# Patient Record
Sex: Male | Born: 1995 | Race: White | Hispanic: No | Marital: Single
Health system: Southern US, Community
[De-identification: ages and names within clinical notes are randomized; demographics above are authoritative.]

## PROBLEM LIST (undated history)

## (undated) HISTORY — PX: TONSILLECTOMY: SUR1361

---

## 2019-05-15 ENCOUNTER — Other Ambulatory Visit: Payer: Self-pay

## 2019-05-15 ENCOUNTER — Encounter (HOSPITAL_COMMUNITY): Payer: Self-pay | Admitting: Emergency Medicine

## 2019-05-15 ENCOUNTER — Emergency Department (HOSPITAL_COMMUNITY): Payer: 59

## 2019-05-15 ENCOUNTER — Emergency Department (HOSPITAL_COMMUNITY)
Admission: EM | Admit: 2019-05-15 | Discharge: 2019-05-15 | Disposition: A | Discharge status: Home / Self Care | Payer: 59 | Attending: Emergency Medicine | Admitting: Emergency Medicine

## 2019-05-15 DIAGNOSIS — Z20822 Contact with and (suspected) exposure to covid-19: Secondary | ICD-10-CM | POA: Diagnosis not present

## 2019-05-15 DIAGNOSIS — N200 Calculus of kidney: Secondary | ICD-10-CM | POA: Diagnosis not present

## 2019-05-15 DIAGNOSIS — R109 Unspecified abdominal pain: Secondary | ICD-10-CM

## 2019-05-15 LAB — CBC WITH DIFFERENTIAL/PLATELET
Abs Immature Granulocytes: 0.1 10*3/uL — ABNORMAL HIGH (ref 0.00–0.07)
Basophils Absolute: 0.1 10*3/uL (ref 0.0–0.1)
Basophils Relative: 0 %
Eosinophils Absolute: 0 10*3/uL (ref 0.0–0.5)
Eosinophils Relative: 0 %
HCT: 47.5 % (ref 39.0–52.0)
Hemoglobin: 16 g/dL (ref 13.0–17.0)
Immature Granulocytes: 1 %
Lymphocytes Relative: 9 %
Lymphs Abs: 1.3 10*3/uL (ref 0.7–4.0)
MCH: 32.9 pg (ref 26.0–34.0)
MCHC: 33.7 g/dL (ref 30.0–36.0)
MCV: 97.7 fL (ref 80.0–100.0)
Monocytes Absolute: 1.2 10*3/uL — ABNORMAL HIGH (ref 0.1–1.0)
Monocytes Relative: 8 %
Neutro Abs: 12.6 10*3/uL — ABNORMAL HIGH (ref 1.7–7.7)
Neutrophils Relative %: 82 %
Platelets: 227 10*3/uL (ref 150–400)
RBC: 4.86 MIL/uL (ref 4.22–5.81)
RDW: 12.2 % (ref 11.5–15.5)
WBC: 15.3 10*3/uL — ABNORMAL HIGH (ref 4.0–10.5)
nRBC: 0 % (ref 0.0–0.2)

## 2019-05-15 LAB — COMPREHENSIVE METABOLIC PANEL
ALT: 37 U/L (ref 0–44)
AST: 34 U/L (ref 15–41)
Albumin: 5.1 g/dL — ABNORMAL HIGH (ref 3.5–5.0)
Alkaline Phosphatase: 89 U/L (ref 38–126)
Anion gap: 15 (ref 5–15)
BUN: 13 mg/dL (ref 6–20)
CO2: 15 mmol/L — ABNORMAL LOW (ref 22–32)
Calcium: 10.2 mg/dL (ref 8.9–10.3)
Chloride: 111 mmol/L (ref 98–111)
Creatinine, Ser: 1.4 mg/dL — ABNORMAL HIGH (ref 0.61–1.24)
GFR calc Af Amer: >60 mL/min (ref >60)
GFR calc non Af Amer: >60 mL/min (ref >60)
Glucose, Bld: 150 mg/dL — ABNORMAL HIGH (ref 70–99)
Potassium: 3.5 mmol/L (ref 3.5–5.1)
Sodium: 141 mmol/L (ref 135–145)
Total Bilirubin: 0.9 mg/dL (ref 0.3–1.2)
Total Protein: 7.7 g/dL (ref 6.5–8.1)

## 2019-05-15 LAB — URINALYSIS, ROUTINE W REFLEX MICROSCOPIC
Bilirubin Urine: NEGATIVE
Glucose, UA: NEGATIVE mg/dL
Ketones, ur: 20 mg/dL — AB
Leukocytes,Ua: NEGATIVE
Nitrite: NEGATIVE
Protein, ur: 100 mg/dL — AB
RBC / HPF: >50 RBC/hpf — ABNORMAL HIGH (ref 0–5)
Specific Gravity, Urine: 1.025 (ref 1.005–1.030)
pH: 5 (ref 5.0–8.0)

## 2019-05-15 LAB — LIPASE, BLOOD: Lipase: 22 U/L (ref 11–51)

## 2019-05-15 LAB — RESPIRATORY PANEL BY RT PCR (FLU A&B, COVID)
Influenza A by PCR: NEGATIVE
Influenza B by PCR: NEGATIVE
SARS Coronavirus 2 by RT PCR: NEGATIVE

## 2019-05-15 MED ORDER — ONDANSETRON HCL 4 MG/2ML IJ SOLN
4.0000 mg | Freq: Once | INTRAMUSCULAR | Status: AC
  Administered 2019-05-15: 4 mg via INTRAVENOUS
  Filled 2019-05-15: qty 2

## 2019-05-15 MED ORDER — HYDROMORPHONE HCL 1 MG/ML IJ SOLN
1.0000 mg | Freq: Once | INTRAMUSCULAR | Status: AC
  Administered 2019-05-15: 1 mg via INTRAVENOUS
  Filled 2019-05-15: qty 1

## 2019-05-15 MED ORDER — TAMSULOSIN HCL 0.4 MG PO CAPS: 0.4000 mg | ORAL_CAPSULE | Freq: Every day | ORAL | 0 refills | Status: AC

## 2019-05-15 MED ORDER — HYDROCODONE-ACETAMINOPHEN 5-325 MG PO TABS: 1.0000 | ORAL_TABLET | ORAL | 0 refills | Status: AC | PRN

## 2019-05-15 MED ORDER — ONDANSETRON HCL 4 MG PO TABS: 4.0000 mg | ORAL_TABLET | Freq: Four times a day (QID) | ORAL | 0 refills | Status: AC

## 2019-05-15 NOTE — ED Triage Notes (Signed)
Per EMS-RLQ pain since this am-CBG 131-vomited this am and twice in ambulance

## 2019-05-15 NOTE — ED Provider Notes (Signed)
[facility]-EMERGENCY DEPT Provider Note   CSN: [id number] Arrival date & time: 05/15/19  1032     History No chief complaint on file.   Adrian Knight is a 24 y.o. male.  The history is provided by the patient and medical records. No language interpreter was used.  Testicle Pain This is a new problem. The current episode started 1 to 2 hours ago (almost 3 hours ago). The problem occurs constantly. The problem has been gradually worsening. Pertinent negatives include no chest pain, no abdominal pain (L flank pain), no headaches and no shortness of breath. Nothing aggravates the symptoms. Nothing relieves the symptoms. He has tried nothing for the symptoms. The treatment provided no relief.       History reviewed. No pertinent past medical history.  There are no problems to display for this patient.   Past Surgical History:  Procedure Laterality Date  . TONSILLECTOMY         No family history on file.  Social History   Tobacco Use  . Smoking status: Not on file  Substance Use Topics  . Alcohol use: Not on file  . Drug use: Not on file    Home Medications Prior to Admission medications   Not on File    Allergies    Patient has no known allergies.  Review of Systems   Review of Systems  Constitutional: Negative for chills, diaphoresis, fatigue and fever.  HENT: Negative for congestion.   Respiratory: Negative for chest tightness and shortness of breath.   Cardiovascular: Negative for chest pain.  Gastrointestinal: Positive for nausea and vomiting. Negative for abdominal distention, abdominal pain (L flank pain), constipation and diarrhea.  Genitourinary: Positive for flank pain, scrotal swelling and testicular pain. Negative for discharge, dysuria, frequency, hematuria, penile pain and urgency.  Musculoskeletal: Positive for back pain (L CVA). Negative for neck pain.  Skin: Negative for rash and wound.  Neurological: Negative for  light-headedness and headaches.  Psychiatric/Behavioral: Negative for agitation.    Physical Exam Updated Vital Signs BP 126/86 (BP Location: Right Arm)   Pulse 79   Temp 97.9 F (36.6 C) (Oral)   Resp 18   SpO2 100%   Physical Exam Vitals and nursing note reviewed. Exam conducted with a chaperone present.  Constitutional:      General: He is not in acute distress.    Appearance: He is well-developed. He is ill-appearing. He is not toxic-appearing or diaphoretic.  HENT:     Head: Normocephalic and atraumatic.     Nose: Nose normal. No congestion or rhinorrhea.     Mouth/Throat:     Mouth: Mucous membranes are moist.     Pharynx: No oropharyngeal exudate or posterior oropharyngeal erythema.  Eyes:     Conjunctiva/sclera: Conjunctivae normal.  Cardiovascular:     Rate and Rhythm: Normal rate and regular rhythm.     Heart sounds: No murmur.  Pulmonary:     Effort: Pulmonary effort is normal. No respiratory distress.     Breath sounds: Normal breath sounds.  Abdominal:     General: Bowel sounds are normal. There is no distension.     Palpations: Abdomen is soft.     Tenderness: There is no abdominal tenderness. There is left CVA tenderness. There is no right CVA tenderness, guarding or rebound.     Hernia: No hernia is present.  Genitourinary:    Penis: Normal and circumcised.      Testes:  Right: Tenderness not present.        Left: Tenderness and swelling present.    Musculoskeletal:        General: Tenderness present.     Cervical back: Neck supple.       Back:     Left lower leg: No edema.     Comments: L CVA tenderness  Skin:    General: Skin is warm and dry.     Capillary Refill: Capillary refill takes less than 2 seconds.     Findings: No erythema.  Neurological:     Mental Status: He is alert.  Psychiatric:        Mood and Affect: Mood normal.     ED Results / Procedures / Treatments   Labs (all labs ordered are listed, but only abnormal  results are displayed) Labs Reviewed  CBC WITH DIFFERENTIAL/PLATELET - Abnormal; Notable for the following components:      Result Value   WBC 15.3 (*)    Neutro Abs 12.6 (*)    Monocytes Absolute 1.2 (*)    Abs Immature Granulocytes 0.10 (*)    All other components within normal limits  COMPREHENSIVE METABOLIC PANEL - Abnormal; Notable for the following components:   CO2 15 (*)    Glucose, Bld 150 (*)    Creatinine, Ser 1.40 (*)    Albumin 5.1 (*)    All other components within normal limits  URINALYSIS, ROUTINE W REFLEX MICROSCOPIC - Abnormal; Notable for the following components:   Hgb urine dipstick LARGE (*)    Ketones, ur 20 (*)    Protein, ur 100 (*)    RBC / HPF >50 (*)    Bacteria, UA RARE (*)    All other components within normal limits  RESPIRATORY PANEL BY RT PCR (FLU A&B, COVID)  URINE CULTURE  LIPASE, BLOOD    EKG None  Radiology CT Renal Stone Study  Result Date: 05/15/2019 CLINICAL DATA:  Left flank and groin pain, vomiting kidney stones suspected EXAM: CT ABDOMEN AND PELVIS WITHOUT CONTRAST TECHNIQUE: Multidetector CT imaging of the abdomen and pelvis was performed following the standard protocol without IV contrast. COMPARISON:  None. FINDINGS: Lower chest: No acute abnormality. Hepatobiliary: No solid liver abnormality is seen. No gallstones, gallbladder wall thickening, or biliary dilatation. Pancreas: Unremarkable. No pancreatic ductal dilatation or surrounding inflammatory changes. Spleen: Normal in size without significant abnormality. Adrenals/Urinary Tract: Adrenal glands are unremarkable. There is a punctuate, less than 2 mm calculus at the left ureterovesicular junction (series 2, image 72). No significant hydronephrosis. Fat stranding about the left kidney. There are additional punctuate bilateral nonobstructive renal calculi. Bladder is unremarkable. Stomach/Bowel: Stomach is within normal limits. Appendix appears normal. No evidence of bowel wall  thickening, distention, or inflammatory changes. Vascular/Lymphatic: No significant vascular findings are present. No enlarged abdominal or pelvic lymph nodes. Reproductive: No mass or other significant abnormality. Other: No abdominal wall hernia or abnormality. No abdominopelvic ascites. Musculoskeletal: No acute or significant osseous findings. IMPRESSION: 1. There is a punctuate, less than 2 mm calculus at the left ureterovesicular junction. No significant hydronephrosis. Fat stranding about the left kidney. 2. There are additional punctuate bilateral nonobstructive renal calculi. Electronically Signed   By: Lauralyn Primes M.D.   On: 05/15/2019 14:56   US SCROTUM W/DOPPLER  Result Date: 05/15/2019 CLINICAL DATA:  Left scrotal region pain EXAM: SCROTAL ULTRASOUND DOPPLER ULTRASOUND OF THE TESTICLES TECHNIQUE: Complete ultrasound examination of the testicles, epididymis, and other scrotal structures was performed. Color and  spectral Doppler ultrasound were also utilized to evaluate blood flow to the testicles. COMPARISON:  None. FINDINGS: Right testicle Measurements: 4.4 x 1.8 x 2.3 cm. No mass or microlithiasis visualized. Left testicle Measurements: 4.3 x 1.9 x 2.5 cm. No mass or microlithiasis visualized. Right epididymis: Normal in size and appearance. No inflammatory focus evident. Left epididymis: Normal in size and appearance. No inflammatory focus evident. Hydrocele:  None visualized. Varicocele:  None visualized. Pulsed Doppler interrogation of both testes demonstrates normal low resistance arterial and venous waveforms bilaterally. No scrotal wall thickening or scrotal abscess on either side. IMPRESSION: Study within normal limits. No mass or inflammatory focus in either intratesticular or extratesticular region. No appreciable hydroceles. No testicular torsion on either side. Electronically Signed   By: Lowella Grip III M.D.   On: 05/15/2019 13:12    Procedures Procedures (including critical  care time)  Medications Ordered in ED Medications  HYDROmorphone (DILAUDID) injection 1 mg (1 mg Intravenous Given 05/15/19 1220)  ondansetron (ZOFRAN) injection 4 mg (4 mg Intravenous Given 05/15/19 1221)    ED Course  I have reviewed the triage vital signs and the nursing notes.  Pertinent labs & imaging results that were available during my care of the patient were reviewed by me and considered in my medical decision making (see chart for details).    MDM Rules/Calculators/A&P                      Adrian Knight is a 24 y.o. male with no significant past medical history who presents with left flank and left groin pain starting suddenly this morning.  He reports that around 9:15 AM, he was laying in bed and tried to roll over moving his leg when he had sudden onset of left testicle pain.  He reports it does radiate from his groin towards his left flank and left CVA area.  He denies history of kidney stones.  He reports he urinated with no hematuria.  No preceding symptoms such as fevers, chills, chest pain, shortness of breath, constipation, or diarrhea.  He reports the pain is extremely severe and he has not yet taken medicine for the symptoms.  He reports that he has not on the right side he has no history of appendicitis.  He denies any history of testicular problems in the past but he does report that years ago he felt he had a similar left testicle pain that was brief and resolved without evaluation.  He is not wanting to move because it hurts his groin but he says he feels left-sided swollen.  He denies any history of hernias.  No dysuria.  No other complaints or previous trauma.  On exam chaperoned, patient had a very tender left hemiscrotum.  Difficult to ascertain cremasteric reflex on him.  Did not have tenderness in the inguinal area and did not palpate a hernia.  Mild left flank tenderness and left CVA tenderness is present.  Bowel sounds were appreciated without other abdominal  tenderness.  Lungs clear and chest nontender.    Clinically I feel we need to rule out torsion initially.  Given the tenderness and pain in the left CVA area, kidney stone is strongly considered and will get urinalysis and labs.  If ultrasound does not show torsion, will get the CT stone study.  We will give patient nausea medicine and pain medicine initially.  We will also get rapid Covid test in case he needs to go to the OR for torsion  surgery.  His he did not eat anything today and he is n.p.o.  Anticipate reassessment after ultrasound and further work-up.  Ultrasound was negative for torsion.  CT scan was ordered and confirmed a 2 mm UVJ stone.  There was no evidence of hydronephrosis.  Patient was already feeling better after medications.  Suspect she either just passed the stone or is nearly ready to pass the stone.  Will give prescription for pain medicine, nausea medicine, and Flomax.  No evidence of infected stone.  Patient was able to eat and drink and felt better.  Patient was discharged with last follow-up with urology.  He understands plan of care and was discharged in good condition.  Final Clinical Impression(s) / ED Diagnoses Final diagnoses:  Kidney stone on left side  Flank pain    Rx / DC Orders ED Discharge Orders         Ordered    tamsulosin (FLOMAX) 0.4 MG CAPS capsule  Daily     05/15/19 1639    HYDROcodone-acetaminophen (NORCO/VICODIN) 5-325 MG tablet  Every 4 hours PRN     05/15/19 1639    ondansetron (ZOFRAN) 4 MG tablet  Every 6 hours     05/15/19 1639         Clinical Impression: 1. Kidney stone on left side   2. Flank pain     Disposition: Discharge  Condition: Good  I have discussed the results, Dx and Tx plan with the pt(& family if present). He/she/they expressed understanding and agree(s) with the plan. Discharge instructions discussed at great length. Strict return precautions discussed and pt &/or family have verbalized understanding of the  instructions. No further questions at time of discharge.    New Prescriptions   HYDROCODONE-ACETAMINOPHEN (NORCO/VICODIN) 5-325 MG TABLET    Take 1 tablet by mouth every 4 (four) hours as needed.   ONDANSETRON (ZOFRAN) 4 MG TABLET    Take 1 tablet (4 mg total) by mouth every 6 (six) hours.   TAMSULOSIN (FLOMAX) 0.4 MG CAPS CAPSULE    Take 1 capsule (0.4 mg total) by mouth daily.    Follow Up: [facility] [address] [place] [address] [phone]       Trisa Cranor, Canary Brim, MD 05/15/19 [phone]

## 2019-05-15 NOTE — Discharge Instructions (Signed)
Your work-up today revealed evidence of a kidney stone causing the symptoms you are experiencing.  There was no evidence of infection.  We suspect you are dehydrated causing this.  Please push hydration for the next few days and use the medications as needed.  Please follow-up with urology.  If any symptoms change or worsen, was return to the nearest emergency department.

## 2019-05-16 LAB — URINE CULTURE: Culture: NO GROWTH

## 2021-04-01 IMAGING — CT CT RENAL STONE PROTOCOL
2 of 4 series · 16 of 46 positions shown, 18 images · non-contrast
Comparison: None.

CLINICAL DATA: Left flank and groin pain, vomiting kidney stones
suspected

EXAM:
CT ABDOMEN AND PELVIS WITHOUT CONTRAST
TECHNIQUE: Multidetector CT imaging of the abdomen and pelvis was performed
following the standard protocol without IV contrast.

[Series 2: axial st · axial · 0.70mm/px · z∈[-449,-64]mm · 13 of 89 slices shown, 15 images]
[im 6/89  soft-tissue]
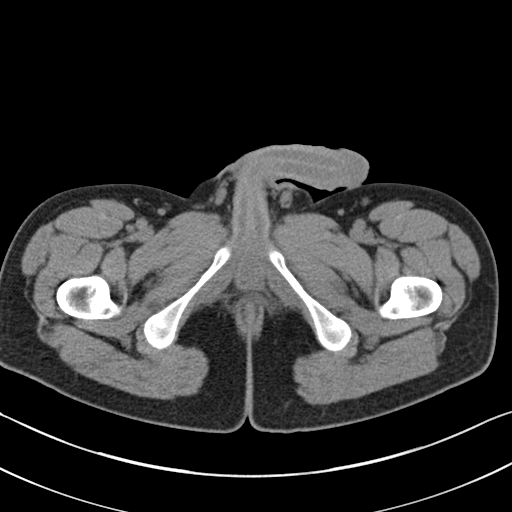
[im 6/89  bone]
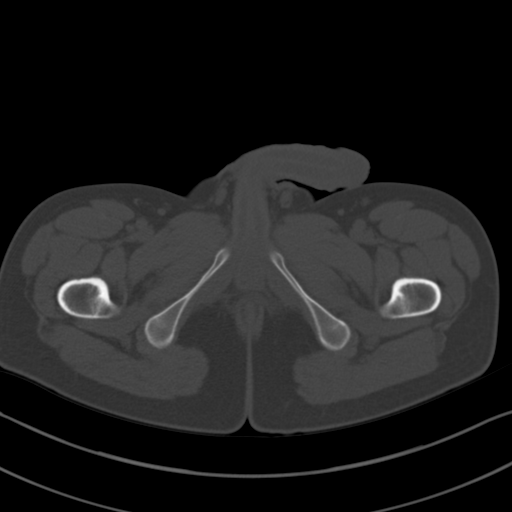
[im 12/89  soft-tissue]
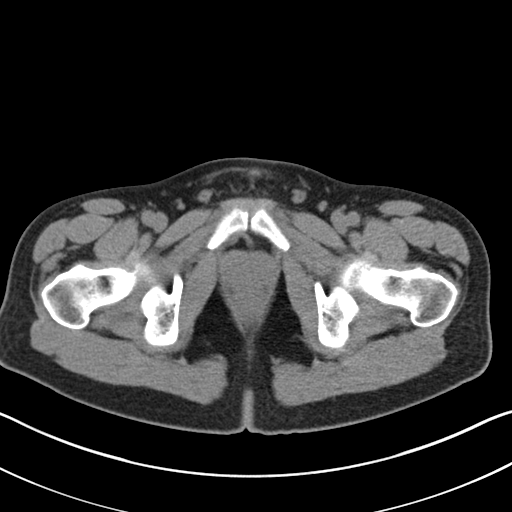
[im 17/89  soft-tissue]
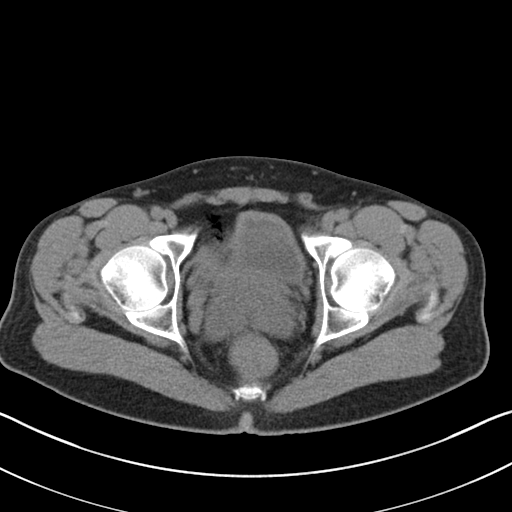
[im 28/89  soft-tissue]
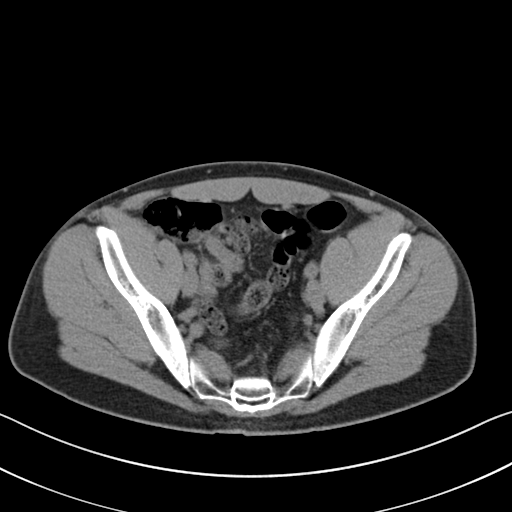
[im 34/89  soft-tissue]
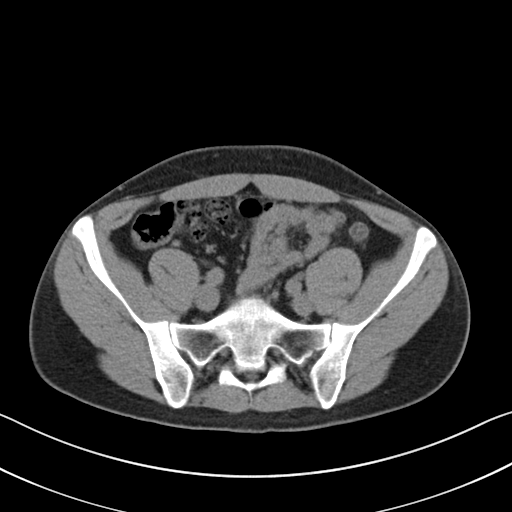
[im 39/89  soft-tissue]
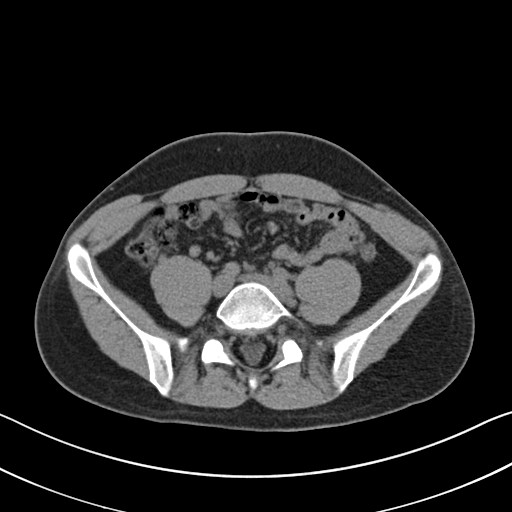
[im 45/89  soft-tissue]
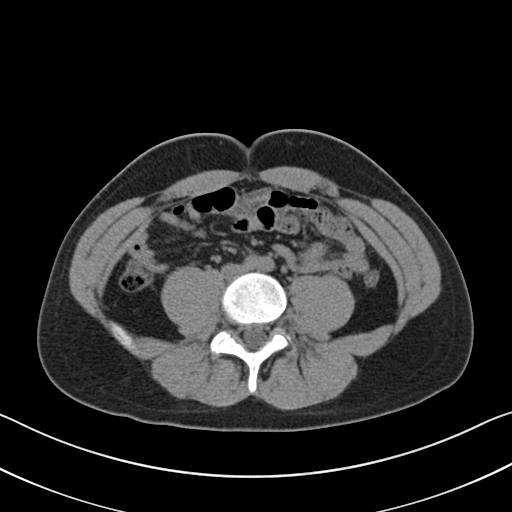
[im 50/89  soft-tissue]
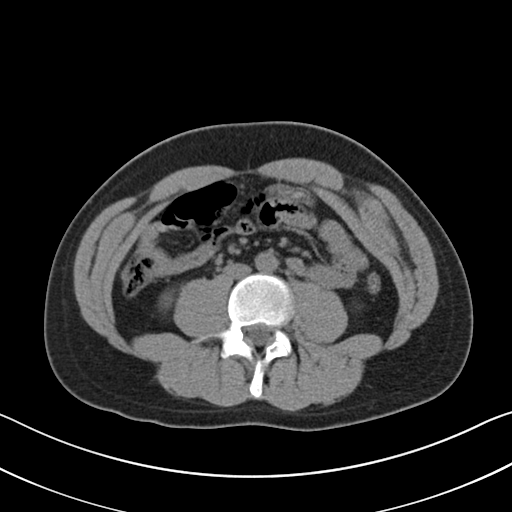
[im 56/89  soft-tissue]
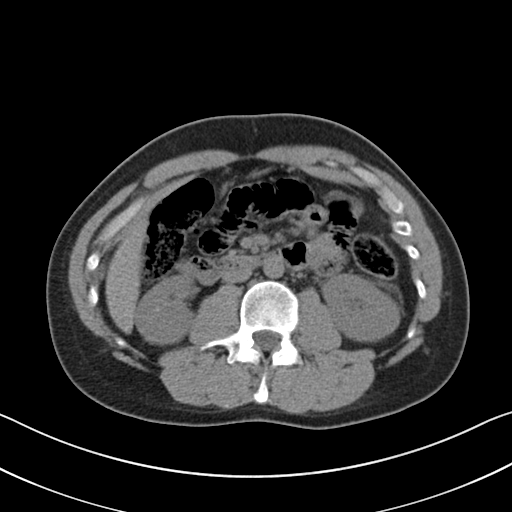
[im 56/89  bone]
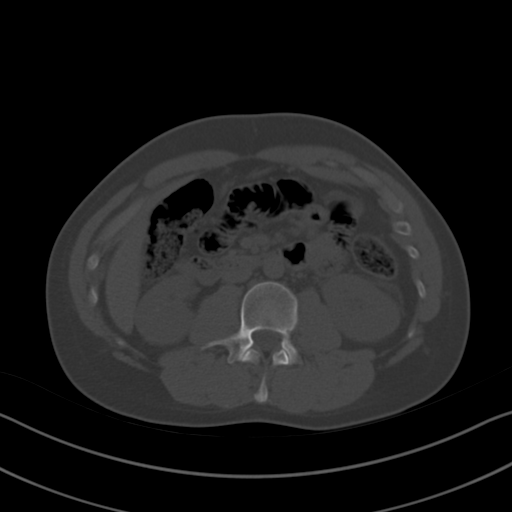
[im 61/89  soft-tissue]
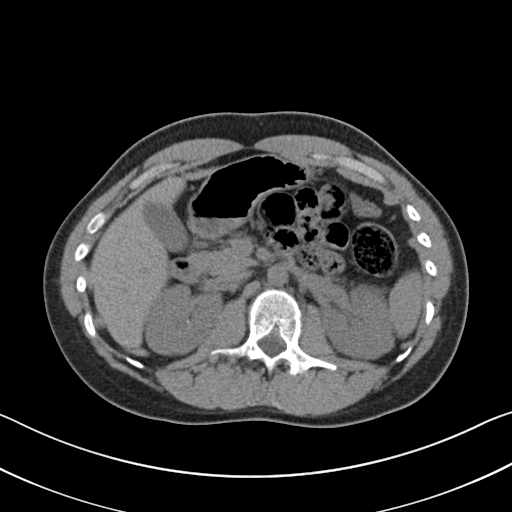
[im 72/89  soft-tissue]
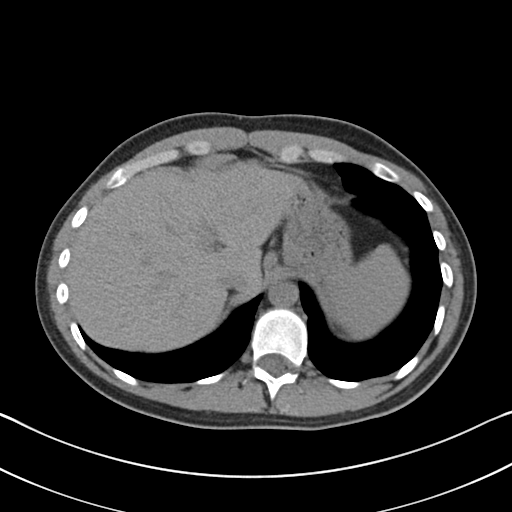
[im 78/89  soft-tissue]
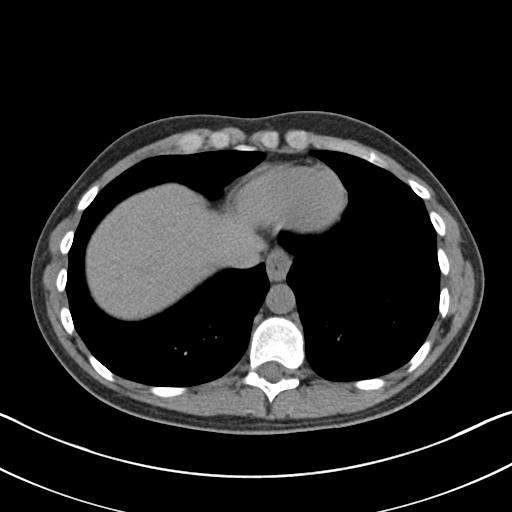
[im 83/89  soft-tissue]
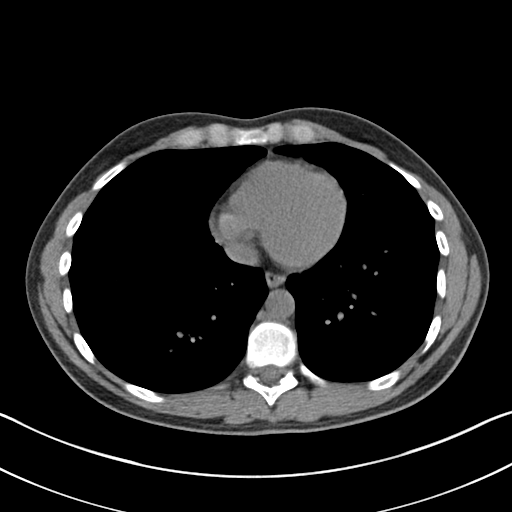

[Series 5: coronal · coronal · 0.75mm/px · 3 of 150 slices shown]
[im 50/150  soft-tissue]
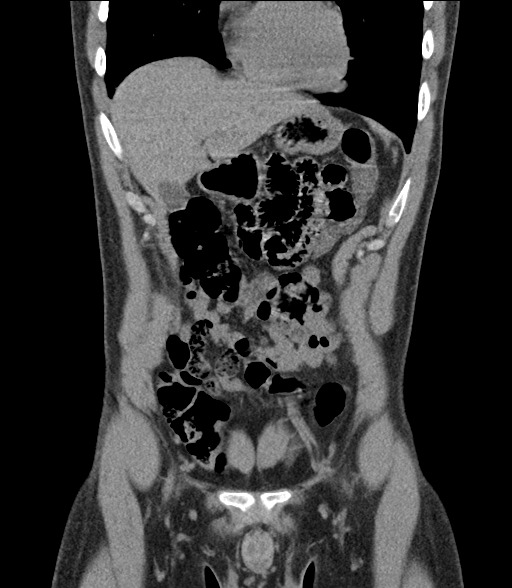
[im 67/150  soft-tissue]
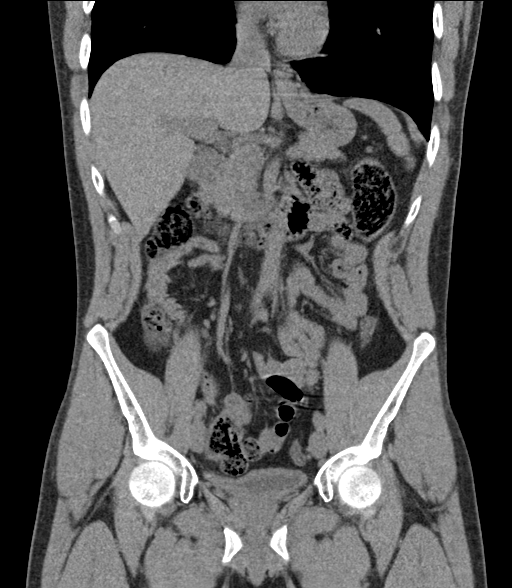
[im 83/150  soft-tissue]
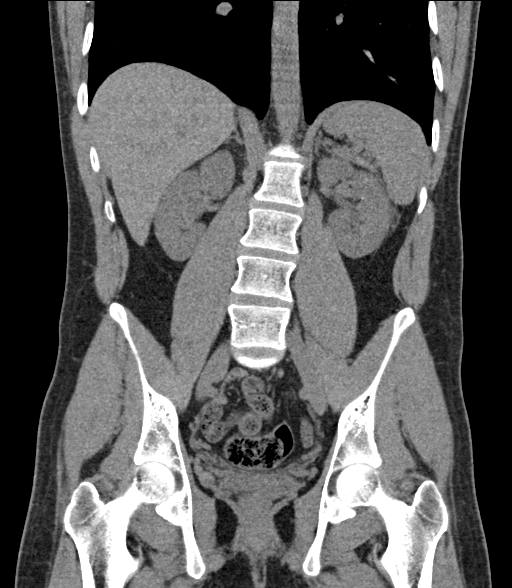

[16 of 46 positions shown; findings below may reference images not displayed]

FINDINGS: Lower chest: No acute abnormality.

Hepatobiliary: No solid liver abnormality is seen. No gallstones,
gallbladder wall thickening, or biliary dilatation.

Pancreas: Unremarkable. No pancreatic ductal dilatation or
surrounding inflammatory changes.

Spleen: Normal in size without significant abnormality.

Adrenals/Urinary Tract: Adrenal glands are unremarkable. There is a
punctuate, less than 2 mm calculus at the left ureterovesicular
junction (series 2, image 72). No significant hydronephrosis. Fat
stranding about the left kidney. There are additional punctuate
bilateral nonobstructive renal calculi. Bladder is unremarkable.

Stomach/Bowel: Stomach is within normal limits. Appendix appears
normal. No evidence of bowel wall thickening, distention, or
inflammatory changes.

Vascular/Lymphatic: No significant vascular findings are present. No
enlarged abdominal or pelvic lymph nodes.

Reproductive: No mass or other significant abnormality.

Other: No abdominal wall hernia or abnormality. No abdominopelvic
ascites.

Musculoskeletal: No acute or significant osseous findings.
IMPRESSION: 1. There is a punctuate, less than 2 mm calculus at the left
ureterovesicular junction. No significant hydronephrosis. Fat
stranding about the left kidney.

2. There are additional punctuate bilateral nonobstructive renal
calculi.
